# Patient Record
Sex: Male | Born: 1965 | Race: White | Hispanic: No | Marital: Married | Smoking: Current every day smoker
Health system: Southern US, Community
[De-identification: ages and names within clinical notes are randomized; demographics above are authoritative.]

## PROBLEM LIST (undated history)

## (undated) DIAGNOSIS — M1990 Unspecified osteoarthritis, unspecified site: Secondary | ICD-10-CM

## (undated) DIAGNOSIS — K219 Gastro-esophageal reflux disease without esophagitis: Secondary | ICD-10-CM

---

## 2008-08-03 HISTORY — PX: BACK SURGERY: SHX140

## 2012-02-13 ENCOUNTER — Emergency Department: Payer: Self-pay | Admitting: Emergency Medicine

## 2012-02-13 LAB — URINALYSIS, COMPLETE
Bilirubin,UR: NEGATIVE
Leukocyte Esterase: NEGATIVE
Ph: 9 (ref 4.5–8.0)
Protein: NEGATIVE
RBC,UR: 4 /HPF (ref 0–5)

## 2012-02-13 LAB — COMPREHENSIVE METABOLIC PANEL
Albumin: 3.6 g/dL (ref 3.4–5.0)
BUN: 16 mg/dL (ref 7–18)
Bilirubin,Total: 0.5 mg/dL (ref 0.2–1.0)
Chloride: 101 mmol/L (ref 98–107)
Creatinine: 1.14 mg/dL (ref 0.60–1.30)
EGFR (African American): >60
EGFR (Non-African Amer.): >60
Glucose: 113 mg/dL — ABNORMAL HIGH (ref 65–99)
SGOT(AST): 45 U/L — ABNORMAL HIGH (ref 15–37)
SGPT (ALT): 46 U/L
Sodium: 133 mmol/L — ABNORMAL LOW (ref 136–145)
Total Protein: 7.5 g/dL (ref 6.4–8.2)

## 2012-02-13 LAB — CBC
HCT: 40.2 % (ref 40.0–52.0)
MCHC: 34.4 g/dL (ref 32.0–36.0)
MCV: 92 fL (ref 80–100)
RBC: 4.37 10*6/uL — ABNORMAL LOW (ref 4.40–5.90)
RDW: 13.3 % (ref 11.5–14.5)
WBC: 11.9 10*3/uL — ABNORMAL HIGH (ref 3.8–10.6)

## 2012-02-13 LAB — CK TOTAL AND CKMB (NOT AT ARMC)
CK, Total: 77 U/L (ref 35–232)
CK-MB: <0.5 ng/mL — ABNORMAL LOW (ref 0.5–3.6)

## 2012-02-16 LAB — BETA STREP CULTURE(ARMC)

## 2017-08-06 ENCOUNTER — Ambulatory Visit (INDEPENDENT_AMBULATORY_CARE_PROVIDER_SITE_OTHER): Payer: BLUE CROSS/BLUE SHIELD | Admitting: Physician Assistant

## 2017-08-06 ENCOUNTER — Encounter: Payer: Self-pay | Admitting: Physician Assistant

## 2017-08-06 VITALS — BP 122/70 | HR 72 | Temp 98.1°F | Resp 16 | Ht 71.0 in | Wt 174.0 lb

## 2017-08-06 DIAGNOSIS — Z1329 Encounter for screening for other suspected endocrine disorder: Secondary | ICD-10-CM

## 2017-08-06 DIAGNOSIS — Z23 Encounter for immunization: Secondary | ICD-10-CM | POA: Diagnosis not present

## 2017-08-06 DIAGNOSIS — Z2821 Immunization not carried out because of patient refusal: Secondary | ICD-10-CM | POA: Diagnosis not present

## 2017-08-06 DIAGNOSIS — Z114 Encounter for screening for human immunodeficiency virus [HIV]: Secondary | ICD-10-CM | POA: Diagnosis not present

## 2017-08-06 DIAGNOSIS — Z13220 Encounter for screening for lipoid disorders: Secondary | ICD-10-CM | POA: Diagnosis not present

## 2017-08-06 DIAGNOSIS — Z1211 Encounter for screening for malignant neoplasm of colon: Secondary | ICD-10-CM | POA: Diagnosis not present

## 2017-08-06 DIAGNOSIS — M7631 Iliotibial band syndrome, right leg: Secondary | ICD-10-CM

## 2017-08-06 DIAGNOSIS — Z131 Encounter for screening for diabetes mellitus: Secondary | ICD-10-CM | POA: Diagnosis not present

## 2017-08-06 DIAGNOSIS — Z130 Encounter for screening for diseases of the blood and blood-forming organs and certain disorders involving the immune mechanism: Secondary | ICD-10-CM | POA: Diagnosis not present

## 2017-08-06 DIAGNOSIS — G8929 Other chronic pain: Secondary | ICD-10-CM | POA: Diagnosis not present

## 2017-08-06 DIAGNOSIS — M25561 Pain in right knee: Secondary | ICD-10-CM | POA: Diagnosis not present

## 2017-08-06 MED ORDER — PREDNISONE 10 MG (21) PO TBPK
ORAL_TABLET | ORAL | 0 refills | Status: DC
Start: 2017-08-06 — End: 2020-05-01
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
## 2017-08-06 NOTE — Patient Instructions (Signed)

## 2017-08-17 ENCOUNTER — Telehealth: Payer: Self-pay | Admitting: Physician Assistant

## 2017-08-17 NOTE — Telephone Encounter (Signed)
Order for cologuard faxed to [facility] °

## 2020-02-02 ENCOUNTER — Other Ambulatory Visit: Payer: Self-pay | Admitting: Family Medicine

## 2020-02-02 DIAGNOSIS — M5416 Radiculopathy, lumbar region: Secondary | ICD-10-CM

## 2020-02-06 ENCOUNTER — Other Ambulatory Visit: Payer: Self-pay

## 2020-02-06 ENCOUNTER — Ambulatory Visit
Admission: RE | Admit: 2020-02-06 | Discharge: 2020-02-06 | Disposition: A | Discharge status: Home / Self Care | Payer: Self-pay | Source: Ambulatory Visit | Attending: Family Medicine | Admitting: Family Medicine

## 2020-02-06 DIAGNOSIS — M5416 Radiculopathy, lumbar region: Secondary | ICD-10-CM

## 2020-04-29 ENCOUNTER — Other Ambulatory Visit: Payer: Self-pay | Admitting: Neurosurgery

## 2020-05-09 ENCOUNTER — Other Ambulatory Visit: Payer: Self-pay

## 2020-05-13 ENCOUNTER — Encounter
Admission: RE | Admit: 2020-05-13 | Discharge: 2020-05-13 | Disposition: A | Discharge status: Home / Self Care | Payer: BC Managed Care – PPO | Source: Ambulatory Visit | Attending: Neurosurgery | Admitting: Neurosurgery

## 2020-05-13 ENCOUNTER — Other Ambulatory Visit: Payer: Self-pay

## 2020-05-13 DIAGNOSIS — Z01818 Encounter for other preprocedural examination: Secondary | ICD-10-CM | POA: Diagnosis present

## 2020-05-13 HISTORY — DX: Unspecified osteoarthritis, unspecified site: M19.90

## 2020-05-13 HISTORY — DX: Gastro-esophageal reflux disease without esophagitis: K21.9

## 2020-05-13 LAB — URINALYSIS, ROUTINE W REFLEX MICROSCOPIC
Bilirubin Urine: NEGATIVE
Glucose, UA: NEGATIVE mg/dL
Hgb urine dipstick: NEGATIVE
Ketones, ur: NEGATIVE mg/dL
Leukocytes,Ua: NEGATIVE
Nitrite: NEGATIVE
Protein, ur: NEGATIVE mg/dL
Specific Gravity, Urine: 1.019 (ref 1.005–1.030)
pH: 5 (ref 5.0–8.0)

## 2020-05-13 LAB — APTT: aPTT: 32 seconds (ref 24–36)

## 2020-05-13 LAB — TYPE AND SCREEN
ABO/RH(D): A POS
Antibody Screen: NEGATIVE

## 2020-05-13 LAB — PROTIME-INR
INR: 1 (ref 0.8–1.2)
Prothrombin Time: 13 seconds (ref 11.4–15.2)

## 2020-05-13 LAB — SURGICAL PCR SCREEN
MRSA, PCR: NEGATIVE
Staphylococcus aureus: NEGATIVE

## 2020-05-13 NOTE — Patient Instructions (Signed)
Your procedure is scheduled on: Mon. 10/18 Report to Day Surgery. To find out your arrival time please call [phone] between 1PM - 3PM on Frid.10/15  Remember: Instructions that are not followed completely may result in serious medical risk,  up to and including death, or upon the discretion of your surgeon and anesthesiologist your  surgery may need to be rescheduled.     _X__ 1. Do not eat food after midnight the night before your procedure.                 No chewing gum or hard candies. You may drink clear liquids up to 2 hours                 before you are scheduled to arrive for your surgery- DO not drink clear                 liquids within 2 hours of the start of your surgery.                 Clear Liquids include:  water, apple juice without pulp, clear Gatorade, G2 or                  Gatorade Zero (avoid Red/Purple/Blue), Black Coffee or Tea (Do not add                 anything to coffee or tea). _____2.   Complete the "Ensure Clear Pre-surgery Clear Carbohydrate Drink" provided to you, 2 hours before arrival. **If you       are diabetic you will be provided with an alternative drink, Gatorade Zero or G2.  __X__2.  On the morning of surgery brush your teeth with toothpaste and water, you                may rinse your mouth with mouthwash if you wish.  Do not swallow any toothpaste of mouthwash.     ___ 3.  No Alcohol for 24 hours before or after surgery.   _X__ 4.  Do Not Smoke or use e-cigarettes For 24 Hours Prior to Your Surgery.                 Do not use any chewable tobacco products for at least 6 hours prior to                 Surgery.  ___  5.  Do not use any recreational drugs (marijuana, cocaine, heroin, ecstasy, MDMA or other)                For at least one week prior to your surgery.  Combination of these drugs with anesthesia                May have life threatening results.  ____  6.  Bring all medications with you on the day of  surgery if instructed.   __x__  7.  Notify your doctor if there is any change in your medical condition      (cold, fever, infections).     Do not wear jewelry,. Do not wear lotions,. You may wear deodorant. Do not shave 48 hours prior to surgery. Men may shave face and neck. Do not bring valuables to the hospital.    [facility] is not responsible for any belongings or valuables.  Contacts, dentures or bridgework may not be worn into surgery. Leave your suitcase in the car. After surgery it may be brought  to your room. For patients admitted to the hospital, discharge time is determined by your treatment team.   Patients discharged the day of surgery will not be allowed to drive home.   Make arrangements for someone to be with you for the first 24 hours of your Same Day Discharge.    Please read over the following fact sheets that you were given:    __x__ Take these medicines the morning of surgery with A SIP OF WATER:    1. omeprazole (PRILOSEC) 20 MG capsule  Night before and morning of surgery  2. gabapentin (NEURONTIN) 300 MG capsule  3. methocarbamol (ROBAXIN) 500 MG tablet if needed  4.  5.  6.  ____ Fleet Enema (as directed)   __x__ Use CHG Soap (or wipes) as directed  ____ Use Benzoyl Peroxide Gel as instructed  ____ Use inhalers on the day of surgery  ____ Stop metformin 2 days prior to surgery    ____ Take 1/2 of usual insulin dose the night before surgery. No insulin the morning          of surgery.   ____ Stop Coumadin/Plavix/aspirin on   __x__ Stop Anti-inflammatories  No ibuprofen aleve or aspirin products until after surgery   ____ Stop supplements until after surgery.    ____ Bring C-Pap to the hospital.    If you have any questions regarding your pre-procedure instructions,  Please call Pre-admit Testing at [phone] [facility]

## 2020-05-16 ENCOUNTER — Other Ambulatory Visit
Admission: RE | Admit: 2020-05-16 | Discharge: 2020-05-16 | Disposition: A | Discharge status: Home / Self Care | Payer: BC Managed Care – PPO | Source: Ambulatory Visit | Attending: Neurosurgery | Admitting: Neurosurgery

## 2020-05-16 ENCOUNTER — Other Ambulatory Visit: Payer: Self-pay

## 2020-05-16 DIAGNOSIS — Z01812 Encounter for preprocedural laboratory examination: Secondary | ICD-10-CM | POA: Diagnosis present

## 2020-05-16 DIAGNOSIS — Z20822 Contact with and (suspected) exposure to covid-19: Secondary | ICD-10-CM | POA: Insufficient documentation

## 2020-05-16 LAB — SARS CORONAVIRUS 2 (TAT 6-24 HRS): SARS Coronavirus 2: NEGATIVE

## 2020-05-20 ENCOUNTER — Ambulatory Visit
Admission: RE | Admit: 2020-05-20 | Discharge: 2020-05-20 | Disposition: A | Discharge status: Home / Self Care | Payer: BC Managed Care – PPO | Attending: Neurosurgery | Admitting: Neurosurgery

## 2020-05-20 ENCOUNTER — Ambulatory Visit: Payer: BC Managed Care – PPO | Admitting: Anesthesiology

## 2020-05-20 ENCOUNTER — Other Ambulatory Visit: Payer: Self-pay

## 2020-05-20 ENCOUNTER — Encounter
Admission: RE | Disposition: A | Discharge status: Home / Self Care | Payer: Self-pay | Source: Home / Self Care | Attending: Neurosurgery

## 2020-05-20 ENCOUNTER — Encounter: Payer: Self-pay | Admitting: Neurosurgery

## 2020-05-20 ENCOUNTER — Ambulatory Visit: Payer: BC Managed Care – PPO

## 2020-05-20 DIAGNOSIS — M48061 Spinal stenosis, lumbar region without neurogenic claudication: Secondary | ICD-10-CM | POA: Diagnosis not present

## 2020-05-20 DIAGNOSIS — Z79899 Other long term (current) drug therapy: Secondary | ICD-10-CM | POA: Diagnosis not present

## 2020-05-20 DIAGNOSIS — M4726 Other spondylosis with radiculopathy, lumbar region: Secondary | ICD-10-CM | POA: Insufficient documentation

## 2020-05-20 DIAGNOSIS — Z7982 Long term (current) use of aspirin: Secondary | ICD-10-CM | POA: Diagnosis not present

## 2020-05-20 DIAGNOSIS — K219 Gastro-esophageal reflux disease without esophagitis: Secondary | ICD-10-CM | POA: Diagnosis not present

## 2020-05-20 DIAGNOSIS — Z419 Encounter for procedure for purposes other than remedying health state, unspecified: Secondary | ICD-10-CM

## 2020-05-20 DIAGNOSIS — F17210 Nicotine dependence, cigarettes, uncomplicated: Secondary | ICD-10-CM | POA: Insufficient documentation

## 2020-05-20 DIAGNOSIS — M5416 Radiculopathy, lumbar region: Secondary | ICD-10-CM | POA: Diagnosis present

## 2020-05-20 HISTORY — PX: LUMBAR LAMINECTOMY/ DECOMPRESSION WITH MET-RX: SHX5959

## 2020-05-20 LAB — ABO/RH: ABO/RH(D): A POS

## 2020-05-20 SURGERY — LUMBAR LAMINECTOMY/ DECOMPRESSION WITH MET-RX
Anesthesia: General | Laterality: Right

## 2020-05-20 MED ORDER — ACETAMINOPHEN 10 MG/ML IV SOLN
INTRAVENOUS | Status: DC | PRN
  Administered 2020-05-20: 1000 mg via INTRAVENOUS

## 2020-05-20 MED ORDER — OXYCODONE HCL 5 MG PO TABS: 5.0000 mg | ORAL_TABLET | Freq: Four times a day (QID) | ORAL | 0 refills | Status: AC | PRN

## 2020-05-20 MED ORDER — METHOCARBAMOL 500 MG PO TABS
1000.0000 mg | ORAL_TABLET | Freq: Once | ORAL | Status: AC
  Administered 2020-05-20: 1000 mg via ORAL

## 2020-05-20 MED ORDER — OXYCODONE HCL 5 MG/5ML PO SOLN: 5.0000 mg | Freq: Once | ORAL | Status: AC | PRN

## 2020-05-20 MED ORDER — FENTANYL CITRATE (PF) 250 MCG/5ML IJ SOLN
INTRAMUSCULAR | Status: DC | PRN
Start: 2020-05-20 — End: 2020-05-20
  Administered 2020-05-20: 50 ug via INTRAVENOUS

## 2020-05-20 MED ORDER — PROMETHAZINE HCL 25 MG/ML IJ SOLN: 6.2500 mg | INTRAMUSCULAR | Status: DC | PRN

## 2020-05-20 MED ORDER — SUCCINYLCHOLINE CHLORIDE 20 MG/ML IJ SOLN
INTRAMUSCULAR | Status: DC | PRN
  Administered 2020-05-20: 100 mg via INTRAVENOUS

## 2020-05-20 MED ORDER — METHOCARBAMOL 500 MG PO TABS
ORAL_TABLET | ORAL | Status: AC
  Filled 2020-05-20: qty 2

## 2020-05-20 MED ORDER — LIDOCAINE HCL (PF) 2 % IJ SOLN
INTRAMUSCULAR | Status: AC
  Filled 2020-05-20: qty 5

## 2020-05-20 MED ORDER — OXYCODONE HCL 5 MG PO TABS
ORAL_TABLET | ORAL | Status: AC
  Filled 2020-05-20: qty 1

## 2020-05-20 MED ORDER — OXYCODONE HCL 5 MG PO TABS: 5.0000 mg | ORAL_TABLET | Freq: Once | ORAL | Status: AC | PRN

## 2020-05-20 MED ORDER — CHLORHEXIDINE GLUCONATE 0.12 % MT SOLN
15.0000 mL | Freq: Once | OROMUCOSAL | Status: AC
  Administered 2020-05-20: 15 mL via OROMUCOSAL

## 2020-05-20 MED ORDER — METHYLPREDNISOLONE ACETATE 40 MG/ML IJ SUSP
INTRAMUSCULAR | Status: DC | PRN
  Administered 2020-05-20: 40 mg

## 2020-05-20 MED ORDER — DEXAMETHASONE SODIUM PHOSPHATE 10 MG/ML IJ SOLN
INTRAMUSCULAR | Status: AC
  Filled 2020-05-20: qty 1

## 2020-05-20 MED ORDER — SUCCINYLCHOLINE CHLORIDE 200 MG/10ML IV SOSY
PREFILLED_SYRINGE | INTRAVENOUS | Status: AC
  Filled 2020-05-20: qty 10

## 2020-05-20 MED ORDER — ACETAMINOPHEN 500 MG PO TABS: 1000.0000 mg | ORAL_TABLET | Freq: Four times a day (QID) | ORAL | 0 refills | Status: AC | PRN

## 2020-05-20 MED ORDER — MIDAZOLAM HCL 2 MG/2ML IJ SOLN
INTRAMUSCULAR | Status: DC | PRN
  Administered 2020-05-20: 2 mg via INTRAVENOUS

## 2020-05-20 MED ORDER — FENTANYL CITRATE (PF) 100 MCG/2ML IJ SOLN
INTRAMUSCULAR | Status: AC
  Administered 2020-05-20: 25 ug via INTRAVENOUS
  Filled 2020-05-20: qty 2

## 2020-05-20 MED ORDER — CHLORHEXIDINE GLUCONATE 0.12 % MT SOLN
OROMUCOSAL | Status: AC
  Filled 2020-05-20: qty 15

## 2020-05-20 MED ORDER — MIDAZOLAM HCL 2 MG/2ML IJ SOLN
INTRAMUSCULAR | Status: AC
  Filled 2020-05-20: qty 2

## 2020-05-20 MED ORDER — ACETAMINOPHEN 10 MG/ML IV SOLN
INTRAVENOUS | Status: AC
  Filled 2020-05-20: qty 100

## 2020-05-20 MED ORDER — ONDANSETRON HCL 4 MG/2ML IJ SOLN
INTRAMUSCULAR | Status: AC
  Filled 2020-05-20: qty 2

## 2020-05-20 MED ORDER — THROMBIN 5000 UNITS EX SOLR
CUTANEOUS | Status: DC | PRN
  Administered 2020-05-20: 5000 [IU] via TOPICAL

## 2020-05-20 MED ORDER — PROPOFOL 10 MG/ML IV BOLUS
INTRAVENOUS | Status: DC | PRN
  Administered 2020-05-20: 160 mg via INTRAVENOUS

## 2020-05-20 MED ORDER — BUPIVACAINE HCL 0.5 % IJ SOLN
INTRAMUSCULAR | Status: DC | PRN
  Administered 2020-05-20: 20 mL

## 2020-05-20 MED ORDER — FENTANYL CITRATE (PF) 100 MCG/2ML IJ SOLN
INTRAMUSCULAR | Status: AC
  Filled 2020-05-20: qty 2

## 2020-05-20 MED ORDER — MEPERIDINE HCL 50 MG/ML IJ SOLN: 6.2500 mg | INTRAMUSCULAR | Status: DC | PRN

## 2020-05-20 MED ORDER — LIDOCAINE HCL (CARDIAC) PF 100 MG/5ML IV SOSY
PREFILLED_SYRINGE | INTRAVENOUS | Status: DC | PRN
  Administered 2020-05-20: 100 mg via INTRAVENOUS

## 2020-05-20 MED ORDER — ROCURONIUM BROMIDE 100 MG/10ML IV SOLN
INTRAVENOUS | Status: DC | PRN
  Administered 2020-05-20: 10 mg via INTRAVENOUS

## 2020-05-20 MED ORDER — BUPIVACAINE-EPINEPHRINE (PF) 0.5% -1:200000 IJ SOLN
INTRAMUSCULAR | Status: DC | PRN
  Administered 2020-05-20: 3 mL

## 2020-05-20 MED ORDER — OXYCODONE HCL 5 MG PO TABS
5.0000 mg | ORAL_TABLET | Freq: Once | ORAL | Status: AC
  Administered 2020-05-20: 5 mg via ORAL
  Filled 2020-05-20: qty 1

## 2020-05-20 MED ORDER — SODIUM CHLORIDE 0.9 % IV SOLN
INTRAVENOUS | Status: DC | PRN
  Administered 2020-05-20: 40 mL

## 2020-05-20 MED ORDER — FENTANYL CITRATE (PF) 100 MCG/2ML IJ SOLN
25.0000 ug | INTRAMUSCULAR | Status: DC | PRN
  Administered 2020-05-20: 25 ug via INTRAVENOUS
  Administered 2020-05-20: 50 ug via INTRAVENOUS
  Administered 2020-05-20: 25 ug via INTRAVENOUS

## 2020-05-20 MED ORDER — KETAMINE HCL 50 MG/ML IJ SOLN
INTRAMUSCULAR | Status: DC | PRN
  Administered 2020-05-20: 50 mg via INTRAMUSCULAR

## 2020-05-20 MED ORDER — CEFAZOLIN SODIUM-DEXTROSE 2-4 GM/100ML-% IV SOLN
2.0000 g | Freq: Once | INTRAVENOUS | Status: AC
  Administered 2020-05-20: 2 g via INTRAVENOUS

## 2020-05-20 MED ORDER — LIDOCAINE HCL 4 % MT SOLN
OROMUCOSAL | Status: DC | PRN
  Administered 2020-05-20: 4 mL via TOPICAL

## 2020-05-20 MED ORDER — ONDANSETRON HCL 4 MG/2ML IJ SOLN
INTRAMUSCULAR | Status: DC | PRN
  Administered 2020-05-20: 4 mg via INTRAVENOUS

## 2020-05-20 MED ORDER — PROPOFOL 10 MG/ML IV BOLUS
INTRAVENOUS | Status: AC
  Filled 2020-05-20: qty 20

## 2020-05-20 MED ORDER — DEXAMETHASONE SODIUM PHOSPHATE 10 MG/ML IJ SOLN
INTRAMUSCULAR | Status: DC | PRN
  Administered 2020-05-20: 10 mg via INTRAVENOUS

## 2020-05-20 MED ORDER — PHENYLEPHRINE HCL (PRESSORS) 10 MG/ML IV SOLN
INTRAVENOUS | Status: DC | PRN
  Administered 2020-05-20 (×5): 100 ug via INTRAVENOUS

## 2020-05-20 MED ORDER — OXYCODONE HCL 5 MG PO TABS
ORAL_TABLET | ORAL | Status: AC
  Administered 2020-05-20: 5 mg via ORAL
  Filled 2020-05-20: qty 1

## 2020-05-20 MED ORDER — SODIUM CHLORIDE 0.9 % IV SOLN
INTRAVENOUS | Status: DC | PRN
  Administered 2020-05-20: 30 ug/min via INTRAVENOUS

## 2020-05-20 MED ORDER — LACTATED RINGERS IV SOLN: INTRAVENOUS | Status: DC

## 2020-05-20 MED ORDER — ROCURONIUM BROMIDE 10 MG/ML (PF) SYRINGE
PREFILLED_SYRINGE | INTRAVENOUS | Status: AC
  Filled 2020-05-20: qty 10

## 2020-05-20 MED ORDER — CEFAZOLIN SODIUM-DEXTROSE 2-4 GM/100ML-% IV SOLN
INTRAVENOUS | Status: AC
  Filled 2020-05-20: qty 100

## 2020-05-20 MED ORDER — FENTANYL CITRATE (PF) 100 MCG/2ML IJ SOLN
INTRAMUSCULAR | Status: AC
Start: 2020-05-20 — End: 2020-05-20
  Administered 2020-05-20: 25 ug via INTRAVENOUS
  Filled 2020-05-20: qty 2

## 2020-05-20 MED ORDER — ORAL CARE MOUTH RINSE: 15.0000 mL | Freq: Once | OROMUCOSAL | Status: AC

## 2020-05-20 SURGICAL SUPPLY — 61 items
ADH SKN CLS APL DERMABOND .7 (GAUZE/BANDAGES/DRESSINGS) ×1
AGENT HMST MTR 8 SURGIFLO (HEMOSTASIS) ×1
APL PRP STRL LF DISP 70% ISPRP (MISCELLANEOUS) ×2
BUR NEURO DRILL SOFT 3.0X3.8M (BURR) ×2 IMPLANT
CANISTER SUCT 1200ML W/VALVE (MISCELLANEOUS) ×4 IMPLANT
CHLORAPREP W/TINT 26 (MISCELLANEOUS) ×4 IMPLANT
CNTNR SPEC 2.5X3XGRAD LEK (MISCELLANEOUS) ×1
CONT SPEC 4OZ STER OR WHT (MISCELLANEOUS) ×1
CONT SPEC 4OZ STRL OR WHT (MISCELLANEOUS) ×1
CONTAINER SPEC 2.5X3XGRAD LEK (MISCELLANEOUS) ×1 IMPLANT
COUNTER NEEDLE 20/40 LG (NEEDLE) ×2 IMPLANT
COVER WAND RF STERILE (DRAPES) ×2 IMPLANT
CUP MEDICINE 2OZ PLAST GRAD ST (MISCELLANEOUS) ×4 IMPLANT
DERMABOND ADVANCED (GAUZE/BANDAGES/DRESSINGS) ×1
DERMABOND ADVANCED .7 DNX12 (GAUZE/BANDAGES/DRESSINGS) ×1 IMPLANT
DRAPE C-ARM 42X72 X-RAY (DRAPES) ×4 IMPLANT
DRAPE LAPAROTOMY 100X77 ABD (DRAPES) ×2 IMPLANT
DRAPE MICROSCOPE SPINE 48X150 (DRAPES) ×2 IMPLANT
DRAPE SURG 17X11 SM STRL (DRAPES) ×8 IMPLANT
DRSG OPSITE POSTOP 3X4 (GAUZE/BANDAGES/DRESSINGS) ×1 IMPLANT
ELECT CAUTERY BLADE TIP 2.5 (TIP) ×2
ELECT EZSTD 165MM 6.5IN (MISCELLANEOUS)
ELECT REM PT RETURN 9FT ADLT (ELECTROSURGICAL) ×2
ELECTRODE CAUTERY BLDE TIP 2.5 (TIP) ×1 IMPLANT
ELECTRODE EZSTD 165MM 6.5IN (MISCELLANEOUS) IMPLANT
ELECTRODE REM PT RTRN 9FT ADLT (ELECTROSURGICAL) ×1 IMPLANT
GLOVE BIOGEL PI IND STRL 7.0 (GLOVE) ×1 IMPLANT
GLOVE BIOGEL PI INDICATOR 7.0 (GLOVE) ×1
GLOVE SURG SYN 7.0 (GLOVE) ×4 IMPLANT
GLOVE SURG SYN 7.0 PF PI (GLOVE) ×2 IMPLANT
GLOVE SURG SYN 8.5  E (GLOVE) ×3
GLOVE SURG SYN 8.5 E (GLOVE) ×3 IMPLANT
GLOVE SURG SYN 8.5 PF PI (GLOVE) ×3 IMPLANT
GOWN SRG XL LVL 3 NONREINFORCE (GOWNS) ×1 IMPLANT
GOWN STRL NON-REIN TWL XL LVL3 (GOWNS) ×2
GOWN STRL REUS W/ TWL XL LVL3 (GOWN DISPOSABLE) ×1 IMPLANT
GOWN STRL REUS W/TWL XL LVL3 (GOWN DISPOSABLE) ×2
GRADUATE 1200CC STRL 31836 (MISCELLANEOUS) ×2 IMPLANT
KIT SPINAL PRONEVIEW (KITS) ×2 IMPLANT
KNIFE BAYONET SHORT DISCETOMY (MISCELLANEOUS) IMPLANT
MARKER SKIN DUAL TIP RULER LAB (MISCELLANEOUS) ×2 IMPLANT
NDL SAFETY ECLIPSE 18X1.5 (NEEDLE) ×1 IMPLANT
NEEDLE HYPO 18GX1.5 SHARP (NEEDLE) ×2
NEEDLE HYPO 22GX1.5 SAFETY (NEEDLE) ×2 IMPLANT
NS IRRIG 1000ML POUR BTL (IV SOLUTION) ×2 IMPLANT
PACK LAMINECTOMY NEURO (CUSTOM PROCEDURE TRAY) ×2 IMPLANT
PAD ARMBOARD 7.5X6 YLW CONV (MISCELLANEOUS) ×2 IMPLANT
SPOGE SURGIFLO 8M (HEMOSTASIS) ×1
SPONGE SURGIFLO 8M (HEMOSTASIS) ×1 IMPLANT
SUT DVC VLOC 3-0 CL 6 P-12 (SUTURE) ×2 IMPLANT
SUT VIC AB 0 CT1 27 (SUTURE) ×2
SUT VIC AB 0 CT1 27XCR 8 STRN (SUTURE) ×1 IMPLANT
SUT VIC AB 2-0 CT1 18 (SUTURE) ×2 IMPLANT
SYR 10ML LL (SYRINGE) ×2 IMPLANT
SYR 20ML LL LF (SYRINGE) ×2 IMPLANT
SYR 30ML LL (SYRINGE) ×4 IMPLANT
SYR 3ML LL SCALE MARK (SYRINGE) ×2 IMPLANT
TOWEL OR 17X26 4PK STRL BLUE (TOWEL DISPOSABLE) ×6 IMPLANT
TUBE MATRX SPINL 18MM 4CM DISP (INSTRUMENTS) ×2
TUBE METRX SPINAL 18X4 DISP (INSTRUMENTS) IMPLANT
TUBING CONNECTING 10 (TUBING) ×2 IMPLANT

## 2020-05-20 NOTE — Discharge Summary (Signed)
Procedure: R L4-5 laminoforaminotomy Procedure date: 05/20/2020 Diagnosis: lumbar radiculopathy    History: Plez Belton is s/p R L4-5 laminoforaminotomy POD0: Tolerated procedure well. Evaluated in post op recovery still disoriented from anesthesia but able to answer questions and obey commands.   Physical Exam: Vitals:   05/20/20 0803 05/20/20 1113  BP: 128/88 103/72  Pulse: 85 75  Resp: 16 13  Temp: (!) 97.1 F (36.2 C) (!) 97.5 F (36.4 C)  SpO2: 100%     General: Drowsy, lying in bed Strength:5/5 throughout  Sensation: intact and symmetric throughout  Skin: incision c/d/i  Data:  No results for input(s): NA, K, CL, CO2, BUN, CREATININE, LABGLOM, GLUCOSE, CALCIUM in the last 168 hours. No results for input(s): AST, ALT, ALKPHOS in the last 168 hours.  Invalid input(s): TBILI   No results for input(s): WBC, HGB, HCT, PLT in the last 168 hours. No results for input(s): APTT, INR in the last 168 hours.       Assessment/Plan:  Cornie Herrington is POD0 s/p R L4-5 laminoforaminotomy. Once he is able to ambulate, tolerate PO, and void, he is cleared for discharge to home.  He will follow up with me in clinic in two weeks.    Patsey Berthold, NP Department of Neurosurgery

## 2020-05-20 NOTE — Progress Notes (Signed)
Patient walked 28 feet with a walker but was not completely confident requiring assistance. Education given regarding the need to walk with the walker independently prior to discharge.The patient verbalized understanding.

## 2020-05-20 NOTE — Transfer of Care (Signed)
Immediate Anesthesia Transfer of Care Note  Patient: Dvante Hands  Procedure(s) Performed: RIGHT L4-5 LAMINOFORAMINOTOMY (Right )  Patient Location: PACU  Anesthesia Type:General  Level of Consciousness: drowsy  Airway & Oxygen Therapy: Patient Spontanous Breathing and Patient connected to face mask oxygen  Post-op Assessment: Report given to RN and Post -op Vital signs reviewed and stable  Post vital signs: Reviewed and stable  Last Vitals:  Vitals Value Taken Time  BP 103/72 05/20/20 1113  Temp 36.4 C 05/20/20 1113  Pulse 75 05/20/20 1116  Resp 10 05/20/20 1116  SpO2 98 % 05/20/20 1116  Vitals shown include unvalidated device data.  Last Pain:  Vitals:   05/20/20 0803  TempSrc: Tympanic  PainSc: 8       Patients Stated Pain Goal: 3 (05/20/20 0803)  Complications: No complications documented.

## 2020-05-20 NOTE — Anesthesia Procedure Notes (Signed)
Procedure Name: Intubation Date/Time: 05/20/2020 9:37 AM Performed by: Eben Burow, CRNA Pre-anesthesia Checklist: Patient identified, Emergency Drugs available, Suction available and Patient being monitored Patient Re-evaluated:Patient Re-evaluated prior to induction Oxygen Delivery Method: Circle system utilized Preoxygenation: Pre-oxygenation with 100% oxygen Induction Type: IV induction Ventilation: Mask ventilation without difficulty Laryngoscope Size: Miller and 2 Grade View: Grade I Tube type: Oral Tube size: 7.5 mm Number of attempts: 1 Airway Equipment and Method: Stylet and LTA kit utilized Placement Confirmation: ETT inserted through vocal cords under direct vision,  positive ETCO2 and breath sounds checked- equal and bilateral Secured at: 24 cm Tube secured with: Tape Dental Injury: Teeth and Oropharynx as per pre-operative assessment

## 2020-05-20 NOTE — H&P (Signed)
I have reviewed and confirmed my history and physical from 04/25/20 with no additions or changes. Plan for R L4-5 laminoforaminotomy .  Risks and benefits reviewed.  Heart sounds normal no MRG. Chest Clear to Auscultation Bilaterally.

## 2020-05-20 NOTE — Discharge Instructions (Signed)
Your surgeon has performed an operation on your lumbar spine (low back) to relieve pressure on one or more nerves. Many times, patients feel better immediately after surgery and can "overdo it." Even if you feel well, it is important that you follow these activity guidelines. If you do not let your back heal properly from the surgery, you can increase the chance of a disc herniation and/or return of your symptoms. The following are instructions to help in your recovery once you have been discharged from the hospital.  * Do not take anti-inflammatory medications for 3 days after surgery (naproxen [Aleve], ibuprofen [Advil, Motrin], celecoxib [Celebrex], etc.)  Activity    No bending, lifting, or twisting ("BLT"). Avoid lifting objects heavier than 10 pounds (gallon milk jug).  Where possible, avoid household activities that involve lifting, bending, pushing, or pulling such as laundry, vacuuming, grocery shopping, and childcare. Try to arrange for help from friends and family for these activities while your back heals.  Increase physical activity slowly as tolerated.  Taking short walks is encouraged, but avoid strenuous exercise. Do not jog, run, bicycle, lift weights, or participate in any other exercises unless specifically allowed by your doctor. Avoid prolonged sitting, including car rides.  Talk to your doctor before resuming sexual activity.  You should not drive until cleared by your doctor.  Until released by your doctor, you should not return to work or school.  You should rest at home and let your body heal.   You may shower two days after your surgery.  After showering, lightly dab your incision dry. Do not take a tub bath or go swimming for 3 weeks, or until approved by your doctor at your follow-up appointment.  If you smoke, we strongly recommend that you quit.  Smoking has been proven to interfere with normal healing in your back and will dramatically reduce the success rate of  your surgery. Please contact [facility] (800-QUIT-NOW) and use the resources at www.[facility].com for assistance in stopping smoking.  Surgical Incision   If you have a dressing on your incision, you may remove it three days after your surgery. Keep your incision area clean and dry.  If you have staples or stitches on your incision, you should have a follow up scheduled for removal. If you do not have staples or stitches, you will have steri-strips (small pieces of surgical tape) or Dermabond glue. The steri-strips/glue should begin to peel away within about a week (it is fine if the steri-strips fall off before then). If the strips are still in place one week after your surgery, you may gently remove them.  Diet            You may return to your usual diet. Be sure to stay hydrated.  When to Contact Us  Although your surgery and recovery will likely be uneventful, you may have some residual numbness, aches, and pains in your back and/or legs. This is normal and should improve in the next few weeks.  However, should you experience any of the following, contact us immediately: . New numbness or weakness . Pain that is progressively getting worse, and is not relieved by your pain medications or rest . Bleeding, redness, swelling, pain, or drainage from surgical incision . Chills or flu-like symptoms . Fever greater than 101.0 F (38.3 C) . Problems with bowel or bladder functions . Difficulty breathing or shortness of breath . Warmth, tenderness, or swelling in your calf  Contact Information . During office hours (Monday-Friday   9 am to 5 pm), please call your physician at [phone] . After hours and weekends, please call [phone] and an answering service will put you in touch with either Dr. Cook or Dr. Yarbrough.  . For a life-threatening emergency, call 911    AMBULATORY SURGERY  DISCHARGE INSTRUCTIONS   1) The drugs that you were given will stay in your system until  tomorrow so for the next 24 hours you should not:  A) Drive an automobile B) Make any legal decisions C) Drink any alcoholic beverage   2) You may resume regular meals tomorrow.  Today it is better to start with liquids and gradually work up to solid foods.  You may eat anything you prefer, but it is better to start with liquids, then soup and crackers, and gradually work up to solid foods.   3) Please notify your doctor immediately if you have any unusual bleeding, trouble breathing, redness and pain at the surgery site, drainage, fever, or pain not relieved by medication.    4) Additional Instructions:        Please contact your physician with any problems or Same Day Surgery at [phone], Monday through Friday 6 am to 4 pm, or [place] [facility] [place] [facility] number at [phone]. 

## 2020-05-20 NOTE — Anesthesia Preprocedure Evaluation (Signed)
Anesthesia Evaluation  Patient identified by MRN, date of birth, ID band Patient awake    Reviewed: Allergy & Precautions, NPO status , Patient's Chart, lab work & pertinent test results  History of Anesthesia Complications Negative for: history of anesthetic complications  Airway Mallampati: II  TM Distance: >3 FB Neck ROM: Full    Dental  (+) Poor Dentition   Pulmonary neg sleep apnea, neg COPD, Current Smoker and Patient abstained from smoking.,    breath sounds clear to auscultation- rhonchi (-) wheezing      Cardiovascular Exercise Tolerance: Good (-) hypertension(-) CAD, (-) Past MI, (-) Cardiac Stents and (-) CABG  Rhythm:Regular Rate:Normal - Systolic murmurs and - Diastolic murmurs    Neuro/Psych neg Seizures negative neurological ROS  negative psych ROS   GI/Hepatic Neg liver ROS, GERD  ,  Endo/Other  neg diabetes  Renal/GU negative Renal ROS     Musculoskeletal  (+) Arthritis ,   Abdominal (+) - obese,   Peds  Hematology negative hematology ROS (+)   Anesthesia Other Findings Past Medical History: No date: Arthritis No date: GERD (gastroesophageal reflux disease)   Reproductive/Obstetrics                             Anesthesia Physical Anesthesia Plan  ASA: II  Anesthesia Plan: General   Post-op Pain Management:    Induction: Intravenous  PONV Risk Score and Plan: 0 and Ondansetron  Airway Management Planned: Oral ETT  Additional Equipment:   Intra-op Plan:   Post-operative Plan: Extubation in OR  Informed Consent: I have reviewed the patients History and Physical, chart, labs and discussed the procedure including the risks, benefits and alternatives for the proposed anesthesia with the patient or authorized representative who has indicated his/her understanding and acceptance.     Dental advisory given  Plan Discussed with: CRNA and  Anesthesiologist  Anesthesia Plan Comments:         Anesthesia Quick Evaluation

## 2020-05-20 NOTE — Op Note (Signed)
Indications: Wayne Watson is a 54 yo male who presented with lumbar radiculopathy.  He failed conservative management and elected for surgical intervention.  Findings: severe lateral recess stenosis  Preoperative Diagnosis: Lumbar Radiculopathy Postoperative Diagnosis: same   EBL: 20 ml IVF: 650 ml Drains: none Disposition: Extubated and Stable to PACU Complications: none  No foley catheter was placed.   Preoperative Note:   Risks of surgery discussed include: infection, bleeding, stroke, coma, death, paralysis, CSF leak, nerve/spinal cord injury, numbness, tingling, weakness, complex regional pain syndrome, recurrent stenosis and/or disc herniation, vascular injury, development of instability, neck/back pain, need for further surgery, persistent symptoms, development of deformity, and the risks of anesthesia. The patient understood these risks and agreed to proceed.  Operative Note:   1. L4-5 lumbar laminoforaminotomy on the right  The patient was then brought from the preoperative center with intravenous access established.  The patient underwent general anesthesia and endotracheal tube intubation, and was then rotated on the Fox rail top where all pressure points were appropriately padded.  The skin was then thoroughly cleansed.  Perioperative antibiotic prophylaxis was administered.  Sterile prep and drapes were then applied and a timeout was then observed.  C-arm was brought into the field under sterile conditions and under lateral visualization the L4-5 interspace was identified and marked.  The incision was marked along the prior incision and injected with local anesthetic. Once this was complete a 3 cm incision was opened with the use of a #10 blade knife.    The metrx tubes were sequentially advanced and confirmed in position at L4-5. An 38mm by 42mm tube was locked in place to the bed side attachment.  The microscope was then sterilely brought into the field and muscle creep  was hemostased with a bipolar and resected with a pituitary rongeur.  A Bovie extender was then used to expose the spinous process and lamina.  Careful attention was placed to not violate the facet capsule. A 3 mm matchstick drill bit was then used to make a hemi-laminotomy trough until the ligamentum flavum was exposed.  This was extended to the base of the spinous process and to the contralateral side to remove all the central bone from each side.  Once this was complete and the underlying ligamentum flavum was visualized, it was dissected with a curette and resected with Kerrison rongeurs.  Extensive ligamentum hypertrophy was noted, requiring a substantial amount of time and care for removal.  The dura was identified and palpated. The kerrison rongeur was then used to remove the medial facet bilaterally until no compression was noted.  A balltip probe was used to confirm decompression of the ipsilateral L5 nerve root.  Significant compression was noted from facet arthropathy and scarring from his prior surgery.  This was carefully removed until the R L5 nerve root was freed. Once freed, we checked the decompression and confirmed that the L5 nerve root had no compression.  Hemostasis was achieved.  No CSF leak was noted.  A Depo-Medrol soaked Gelfoam pledget was placed in the defect.  The wound was copiously irrigated. The tube system was then removed under microscopic visualization and hemostasis was obtained with a bipolar.    The fascial layer was reapproximated with the use of a 0 Vicryl suture.  Subcutaneous tissue layer was reapproximated using 2-0 Vicryl suture.  3-0 monocryl was placed in subcuticular fashion. The skin was then cleansed and Dermabond was used to close the skin opening.  Patient was then rotated back to  the preoperative bed awakened from anesthesia and taken to recovery all counts are correct in this case.  I performed the entire procedure with the assistance of Wayne Berthold NP  as an Designer, television/film set.  Simuel Stebner K. Myer Haff MD

## 2020-05-20 NOTE — Consult Note (Signed)
Pharmacy Antibiotic Note  Wayne Watson is a 54 y.o. male admitted with UTI and surgical prophylaxis.  Pharmacy has been consulted for Cefazolin dosing.  Plan: Cefazolin 2g IV x 1  To be administered within 60 minutes of surgical incision.   May repeat dose intraoperatively in 4 hours if procedure is lengthy or if there is excessive blood loss.   Contact pharmacy if additional dose is warranted.     Thank you for allowing pharmacy to be a part of this patient's care.  Albina Billet, PharmD, BCPS Clinical Pharmacist 05/20/2020 7:52 AM

## 2020-05-20 NOTE — Anesthesia Postprocedure Evaluation (Signed)
Anesthesia Post Note  Patient: Wayne Watson  Procedure(s) Performed: RIGHT L4-5 LAMINOFORAMINOTOMY (Right )  Patient location during evaluation: PACU Anesthesia Type: General Level of consciousness: awake and alert and oriented Pain management: pain level controlled Vital Signs Assessment: post-procedure vital signs reviewed and stable Respiratory status: spontaneous breathing, nonlabored ventilation and respiratory function stable Cardiovascular status: blood pressure returned to baseline and stable Postop Assessment: no signs of nausea or vomiting Anesthetic complications: no   No complications documented.   Last Vitals:  Vitals:   05/20/20 1300 05/20/20 1456  BP: (!) 122/92 117/79  Pulse: 79 85  Resp: 18 16  Temp: 36.4 C 36.6 C  SpO2: 97% 98%    Last Pain:  Vitals:   05/20/20 1456  TempSrc: Temporal  PainSc: 4                  Romelia Bromell

## 2020-05-21 ENCOUNTER — Encounter: Payer: Self-pay | Admitting: Neurosurgery

## 2021-07-01 IMAGING — RF DG C-ARM 1-60 MIN
1 series · 2 of 2 positions shown · IV contrast (agent unspecified)
Comparison: Lumbar spine MRI 02/06/2020

CLINICAL DATA: L4-L5 surgery.

EXAM:
DG C-ARM 1-60 MIN; LUMBAR SPINE - 2-3 VIEW
CONTRAST:  None
FLUOROSCOPY TIME:  Fluoroscopy Time:  4 seconds
Number of Acquired Spot Images: 2

[Series 1: unknown protocol · 0.20mm/px · 2 of 2 slices shown]
[im 1/2]
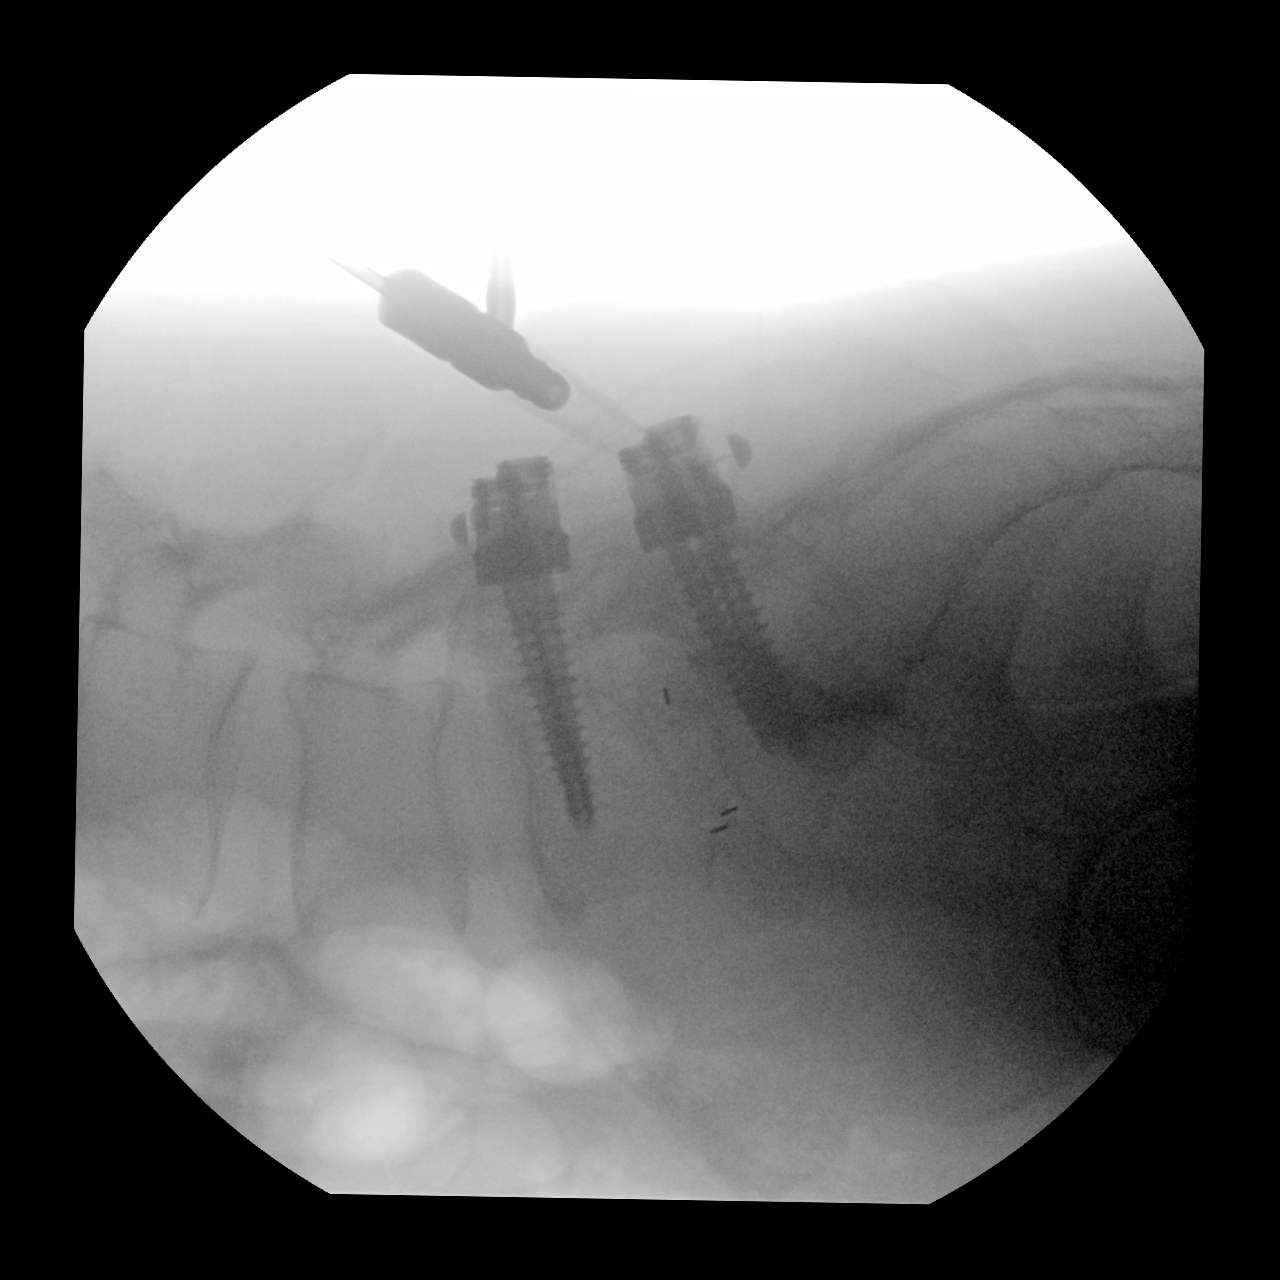
[im 2/2]
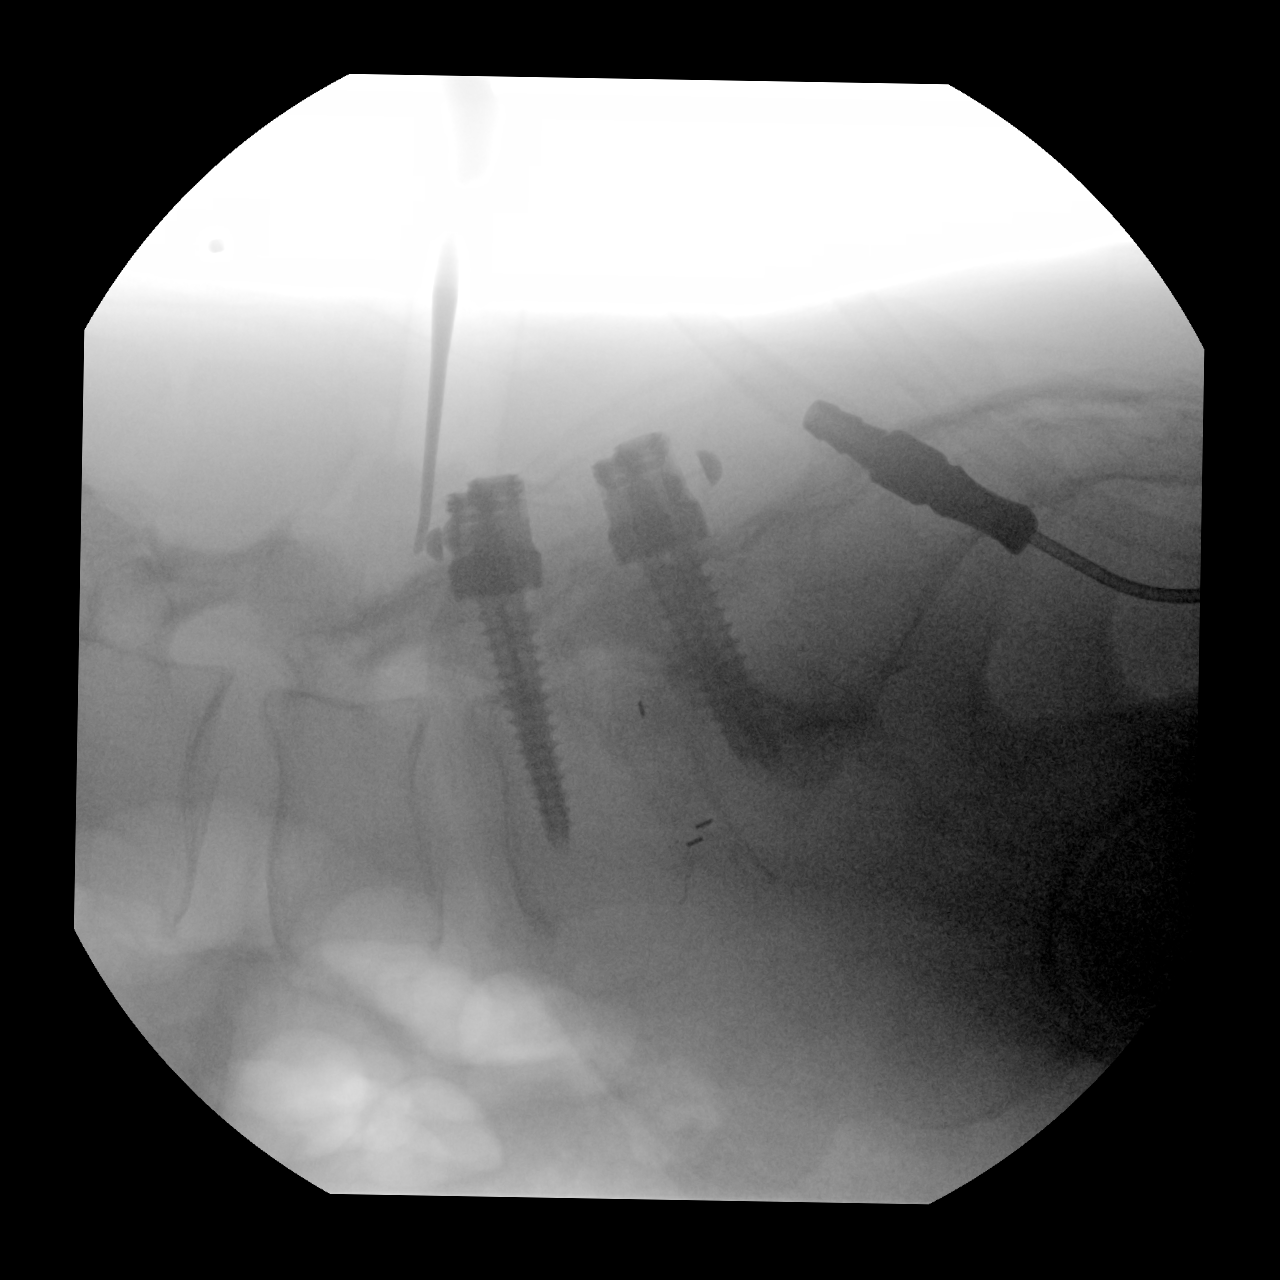

[2 of 2 positions shown; findings below may reference images not displayed]

FINDINGS: Two fluoroscopic images were obtained of the lower lumbar spine at
the lumbosacral junction. Again noted is pedicle screw and interbody
fixation at L5-S1. Second image demonstrates surgical marking at the
level of L4-L5.
IMPRESSION: Surgical marking at L4-L5.
